# Patient Record
Sex: Female | Born: 1955 | Race: Black or African American | Hispanic: No | Marital: Single | Smoking: Current every day smoker
Health system: Southern US, Community
[De-identification: ages and names within clinical notes are randomized; demographics above are authoritative.]

## PROBLEM LIST (undated history)

## (undated) DIAGNOSIS — M5126 Other intervertebral disc displacement, lumbar region: Secondary | ICD-10-CM

## (undated) DIAGNOSIS — M5136 Other intervertebral disc degeneration, lumbar region: Secondary | ICD-10-CM

## (undated) DIAGNOSIS — M51369 Other intervertebral disc degeneration, lumbar region without mention of lumbar back pain or lower extremity pain: Secondary | ICD-10-CM

## (undated) DIAGNOSIS — M1990 Unspecified osteoarthritis, unspecified site: Secondary | ICD-10-CM

## (undated) DIAGNOSIS — I10 Essential (primary) hypertension: Secondary | ICD-10-CM

## (undated) HISTORY — PX: JOINT REPLACEMENT: SHX530

## (undated) HISTORY — PX: ABDOMINAL HYSTERECTOMY: SHX81

---

## 2005-06-15 ENCOUNTER — Ambulatory Visit (HOSPITAL_BASED_OUTPATIENT_CLINIC_OR_DEPARTMENT_OTHER): Admission: RE | Admit: 2005-06-15 | Discharge: 2005-06-15 | Payer: Self-pay | Admitting: Orthopedic Surgery

## 2006-09-13 ENCOUNTER — Ambulatory Visit (HOSPITAL_BASED_OUTPATIENT_CLINIC_OR_DEPARTMENT_OTHER): Admission: RE | Admit: 2006-09-13 | Discharge: 2006-09-13 | Payer: Self-pay | Admitting: Orthopedic Surgery

## 2010-07-14 NOTE — Op Note (Signed)
[name], [name]                ACCOUNT NO.:  [id number]   MEDICAL RECORD NO.:  [id number]          PATIENT TYPE:  AMB   LOCATION:  DSC                          FACILITY:  [facility]   PHYSICIAN:  Katy Fitch. Sypher, M.D. DATE OF BIRTH:  Jan 17, 1956   DATE OF PROCEDURE:  09/13/2006  DATE OF DISCHARGE:                               OPERATIVE REPORT   PREOPERATIVE DIAGNOSIS:  Entrapped neuropathy median nerve, left carpal  tunnel (severe).   POSTOPERATIVE DIAGNOSIS:  Entrapped neuropathy median nerve, left carpal  tunnel (severe).   OPERATION:  Release of left transverse carpal ligament.   OPERATING SURGEON:  Josephine Igo, M.D.   ASSISTANT:  Molly Maduro Dasnoit PA-C.   ANESTHESIA:  General by LMA.   SUPERVISING ANESTHESIOLOGIST:  Dr. Jairo Ben.   INDICATIONS:  [name] is a 55 year old woman referred for  evaluation and management of bilateral hand numbness.  She is status  post release of her right transcarpal ligament.  She recently returned  with severe numbness in the left hand.  Clinical examination and  electrodiagnostic studies demonstrated substantial left median  neuropathy at the level of the wrist.   Due to a failed respond to nonoperative measures, she is brought to the  operating room at this time for release of her left transverse carpal  ligament.   PROCEDURE:  Sabrina Alexander was brought to the operating room and placed in  supine position upon the operating table.   Following induction of general anesthesia by LMA technique, the left arm  was prepped with Betadine soap solution and sterilely draped.  A  pneumatic tourniquet was applied to the proximal brachium.   Following exsanguination of the left arm with an Esmarch bandage, an  arterial tourniquet was inflated to 220 mmHg.  The procedure commenced  with a short incision in the line of the ring finger in the palm.  The  subcutaneous tissues were carefully divided, revealing the palmar  fascia.  The  palmar fascia was split, revealing a rather complex anatomy  of the distal margin of the transverse carpal ligament.   There were two layers which were gently isolated from the thenar  musculature and the ulnar bursa overlying the median nerve.   Once a path was created along the ulnar aspect of the transverse carpal  ligament, separating the ulnar bursa from the deep side of the ligament,  the ligament was released along its ulnar border with scissors extending  into the distal forearm.   This widely opened the carpal canal.   No mass or other predicaments were noted.   Bleeding points along the margin of the released ligament were  electrocauterized with bipolar current followed by repair of the skin  with intradermal 3-0 Prolene suture.   A compressive dressing was applied with a volar plaster splint,  maintaining the wrist in 5 degrees of dorsiflexion.   For aftercare, Ms. Rockefeller was provided a prescription for Vicodin 5 mg  one p.o. every four to six hours p.r.n. pain (20 tablets without  refill).   She will return to our office for follow-up in one  week or sooner if  there are any problems.      Katy Fitch Sypher, M.D.  Electronically Signed     RVS/MEDQ  D:  09/13/2006  T:  09/14/2006  Job:  [id number]   cc:   Katy Fitch. Sypher, M.D.

## 2010-07-17 NOTE — Op Note (Signed)
NAMEMARIETA, Sabrina Alexander                ACCOUNT NO.:  [id number]   MEDICAL RECORD NO.:  [id number]          PATIENT TYPE:  AMB   LOCATION:  DSC                          FACILITY:  [facility]   PHYSICIAN:  Katy Fitch. Sypher, M.D. DATE OF BIRTH:  1955-03-31   DATE OF PROCEDURE:  06/15/2005  DATE OF DISCHARGE:  06/15/2005                                 OPERATIVE REPORT   For the record this dictation was lost when the e-sign had a malfunction. I  was observing the operative note, when the e-sign had a technical breakdown  and I actually watched the op note disappear off the screen.   PREOPERATIVE DIAGNOSIS:  Right carpal tunnel syndrome.   POSTOP DIAGNOSIS:  Right carpal tunnel syndrome.   OPERATIONS:  Release of right transverse carpal ligament.   OPERATING SURGEON:  Josephine Igo, M.D.   ASSISTANT:  Molly Maduro Dasnoit PA-C.   ANESTHESIA:  General by LMA.   SUPERVISING ANESTHESIOLOGIST:  I do not know because I do not have the  record.  That can be found in the operative notes handwritten.   INDICATIONS:  Sabrina Alexander is a 55 year old woman employed by [employer] referred by Dr. Heide Scales for evaluation and  management of hand numbness and pain.   Clinical examination suggested carpal tunnel syndrome.  Electrodiagnostic  studies that had been previously obtained on April 27, 2005 documented a  severe right carpal tunnel syndrome.   She was referred for upper extremity orthopedic evaluation, anticipating  release of her right transverse carpal ligament.   PROCEDURE:  Sabrina Alexander is brought to the operating room and placed in  supine position on the table.   Following anesthesia consultation, general anesthesia by LMA was suggested  and induced.   The right arm was prepped with Betadine soap solution, sterilely draped.  A  pneumatic tourniquet on proximal brachium was inflated to 220 mmHg.   Procedure commenced with a short incision in line of the ring  finger.  The  subcutaneous tissues were carefully divided revealing the palmar fascia.  This was split longitudinally to reveal the common sensory branch of the  median nerve.   These were followed back to transverse carpal ligament which was gently  isolated from the median nerve.   The ligament released along its ulnar border extending into the distal  forearm.  No masses or other predicaments were noted in the carpal canal.   Bleeding points along the margin of the released ligament were  electrocauterized with bipolar current followed by repair of the skin with  intradermal 3-0 Prolene suture.   A compressive dressing applied with a volar plaster splint maintaining the  wrist in 5 degrees dorsiflexion.      Katy Fitch Sypher, M.D.  Electronically Signed     RVS/MEDQ  D:  07/13/2005  T:  07/14/2005  Job:  [id number]

## 2010-12-14 LAB — POCT HEMOGLOBIN-HEMACUE
Hemoglobin: 14.5
Operator id: 128471

## 2013-12-23 DIAGNOSIS — N951 Menopausal and female climacteric states: Secondary | ICD-10-CM | POA: Insufficient documentation

## 2014-08-16 DIAGNOSIS — Z90710 Acquired absence of both cervix and uterus: Secondary | ICD-10-CM | POA: Insufficient documentation

## 2015-03-17 ENCOUNTER — Encounter (HOSPITAL_BASED_OUTPATIENT_CLINIC_OR_DEPARTMENT_OTHER): Payer: Self-pay | Admitting: *Deleted

## 2015-03-17 ENCOUNTER — Emergency Department (HOSPITAL_BASED_OUTPATIENT_CLINIC_OR_DEPARTMENT_OTHER)
Admission: EM | Admit: 2015-03-17 | Discharge: 2015-03-17 | Disposition: A | Discharge status: Home / Self Care | Payer: BLUE CROSS/BLUE SHIELD | Attending: Emergency Medicine | Admitting: Emergency Medicine

## 2015-03-17 ENCOUNTER — Emergency Department (HOSPITAL_BASED_OUTPATIENT_CLINIC_OR_DEPARTMENT_OTHER): Payer: BLUE CROSS/BLUE SHIELD

## 2015-03-17 DIAGNOSIS — M19072 Primary osteoarthritis, left ankle and foot: Secondary | ICD-10-CM | POA: Insufficient documentation

## 2015-03-17 DIAGNOSIS — F17200 Nicotine dependence, unspecified, uncomplicated: Secondary | ICD-10-CM | POA: Insufficient documentation

## 2015-03-17 DIAGNOSIS — M79672 Pain in left foot: Secondary | ICD-10-CM | POA: Diagnosis present

## 2015-03-17 HISTORY — DX: Unspecified osteoarthritis, unspecified site: M19.90

## 2015-03-17 MED ORDER — MELOXICAM 7.5 MG PO TABS
7.5000 mg | ORAL_TABLET | Freq: Every day | ORAL | Status: DC
Start: 2015-03-17 — End: 2015-05-15

## 2015-03-17 NOTE — ED Provider Notes (Signed)
CSN: [id number]     Arrival date & time 03/17/15  1640 History   First MD Initiated Contact with Patient 03/17/15 1923     Chief Complaint  Patient presents with  . Foot Pain     (Consider location/radiation/quality/duration/timing/severity/associated sxs/prior Treatment) The history is provided by the patient.     Pt presents with left foot pain x 1 month.  Pain is aching, across top of foot, worse with stapping on it after period of rest.  Denies weakness or numbness of the foot.  Denies injury.  Denies fever, chills, leg swelling.  She has no medical problems with the exception of arthritis.   No new shoes or change in activities.  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  She has tried heat, ice, ace wrap, ibuprofen without relief.   Past Medical History  Diagnosis Date  . Arthritis    Past Surgical History  Procedure Laterality Date  . Abdominal hysterectomy     No family history on file. Social History  Substance Use Topics  . Smoking status: Current Every Day Smoker -- 0.50 packs/day  . Smokeless tobacco: None  . Alcohol Use: No   OB History    No data available     Review of Systems  Constitutional: Negative for fever and chills.  Cardiovascular: Negative for leg swelling.  Musculoskeletal: Positive for arthralgias.  Skin: Negative for color change and wound.  Allergic/Immunologic: Negative for immunocompromised state.  Neurological: Negative for weakness and numbness.  Hematological: Does not bruise/bleed easily.  Psychiatric/Behavioral: Negative for self-injury.      Allergies  Review of patient's allergies indicates no known allergies.  Home Medications   Prior to Admission medications   Medication Sig Start Date End Date Taking? Authorizing Provider  meloxicam (MOBIC) 7.5 MG tablet Take 1 tablet (7.5 mg total) by mouth daily. 03/17/15   Trixie DredgeEmily Kanyia Heaslip, PA-C   BP 155/92 mmHg  Pulse 77  Temp(Src) 98 F (36.7 C) (Oral)  Resp  18  Ht 5\' 6"  (1.676 m)  Wt 91.173 kg  BMI 32.46 kg/m2  SpO2 98% Physical Exam  Constitutional: She appears well-developed and well-nourished. No distress.  HENT:  Head: Normocephalic and atraumatic.  Neck: Neck supple.  Pulmonary/Chest: Effort normal.  Musculoskeletal:       Left ankle: Normal.       Left lower leg: Normal.       Left foot: There is bony tenderness. There is normal range of motion, no swelling, normal capillary refill, no crepitus and no laceration.       Feet:  Left foot no skin changes.  No erythema, edema, warmth, discharge, or tenderness.  Moves all toes easily, sensation intact, capillary refill < 2 seconds.  Dorsalis pedis pulse intact.    Neurological: She is alert.  Skin: She is not diaphoretic.  Nursing note and vitals reviewed.   ED Course  Procedures (including critical care time) Labs Review Labs Reviewed - No data to display  Imaging Review Dg Foot Complete Left  03/17/2015  CLINICAL DATA:  Left foot pain for 1 month. EXAM: LEFT FOOT - COMPLETE 3+ VIEW COMPARISON:  None. FINDINGS: Advanced midfoot degenerative changes suspicious for early Charcot disease. Un inflammatory arthropathy is also possible but probably less likely at this location. There are mild degenerative changes at the metatarsal phalangeal joints and interphalangeal joints. Moderate degenerative changes involving the first metatarsal phalangeal joint and probable subchondral cystic change or erosion. There is  significant hallux valgus deformity. No acute bony findings or air destructive bony changes. Small calcaneal heel spur. IMPRESSION: No acute fracture. Advanced midfoot degenerative changes could reflect early Charcot disease/ neuropathic process. Electronically Signed   By: Rudie Meyer M.D.   On: 03/17/2015 17:13     EKG Interpretation None      MDM   Final diagnoses:  Left foot pain  Osteoarthritis of left foot, unspecified osteoarthritis type    Afebrile, nontoxic  patient with left dorsal foot pain x 1 month without injury. Xray shows Extensive arthritis/degenerative changes, possible early charcot foot.  Neurovascularly intact.    D/C home with orthopedic,podiatry follow up, mobic.   Discussed result, findings, treatment, and follow up  with patient.  Pt given return precautions.  Pt verbalizes understanding and agrees with plan.         Trixie Dredge, PA-C 03/17/15 2127  Rolan Bucco, MD 03/17/15 2131

## 2015-03-17 NOTE — ED Notes (Signed)
Pt verbalizes understanding of d/c instructions and denies any further needs at this time. 

## 2015-03-17 NOTE — ED Notes (Signed)
Left foot pain, no injury, no significant swelling or trauma, pain unrelieved by OTC meds

## 2015-03-17 NOTE — Discharge Instructions (Signed)
Read the information below.  Use the prescribed medication as directed.  Please discuss all new medications with your pharmacist.  You may return to the Emergency Department at any time for worsening condition or any new symptoms that concern you.    If you develop uncontrolled pain, weakness or numbness of the extremity, severe discoloration of the skin, or you are unable to walk or move your toes, return to the ER for a recheck.       Osteoarthritis Osteoarthritis is a disease that causes soreness and inflammation of a joint. It occurs when the cartilage at the affected joint wears down. Cartilage acts as a cushion, covering the ends of bones where they meet to form a joint. Osteoarthritis is the most common form of arthritis. It often occurs in older people. The joints affected most often by this condition include those in the:  Ends of the fingers.  Thumbs.  Neck.  Lower back.  Knees.  Hips. CAUSES  Over time, the cartilage that covers the ends of bones begins to wear away. This causes bone to rub on bone, producing pain and stiffness in the affected joints.  RISK FACTORS Certain factors can increase your chances of having osteoarthritis, including:  Older age.  Excessive body weight.  Overuse of joints.  Previous joint injury. SIGNS AND SYMPTOMS   Pain, swelling, and stiffness in the joint.  Over time, the joint may lose its normal shape.  Small deposits of bone (osteophytes) may grow on the edges of the joint.  Bits of bone or cartilage can break off and float inside the joint space. This may cause more pain and damage. DIAGNOSIS  Your health care provider will do a physical exam and ask about your symptoms. Various tests may be ordered, such as:  X-rays of the affected joint.  Blood tests to rule out other types of arthritis. Additional tests may be used to diagnose your condition. TREATMENT  Goals of treatment are to control pain and improve joint function.  Treatment plans may include:  A prescribed exercise program that allows for rest and joint relief.  A weight control plan.  Pain relief techniques, such as:  Properly applied heat and cold.  Electric pulses delivered to nerve endings under the skin (transcutaneous electrical nerve stimulation [TENS]).  Massage.  Certain nutritional supplements.  Medicines to control pain, such as:  Acetaminophen.  Nonsteroidal anti-inflammatory drugs (NSAIDs), such as naproxen.  Narcotic or central-acting agents, such as tramadol.  Corticosteroids. These can be given orally or as an injection.  Surgery to reposition the bones and relieve pain (osteotomy) or to remove loose pieces of bone and cartilage. Joint replacement may be needed in advanced states of osteoarthritis. HOME CARE INSTRUCTIONS   Take medicines only as directed by your health care provider.  Maintain a healthy weight. Follow your health care provider's instructions for weight control. This may include dietary instructions.  Exercise as directed. Your health care provider can recommend specific types of exercise. These may include:  Strengthening exercises. These are done to strengthen the muscles that support joints affected by arthritis. They can be performed with weights or with exercise bands to add resistance.  Aerobic activities. These are exercises, such as brisk walking or low-impact aerobics, that get your heart pumping.  Range-of-motion activities. These keep your joints limber.  Balance and agility exercises. These help you maintain daily living skills.  Rest your affected joints as directed by your health care provider.  Keep all follow-up visits as  directed by your health care provider. SEEK MEDICAL CARE IF:   Your skin turns red.  You develop a rash in addition to your joint pain.  You have worsening joint pain.  You have a fever along with joint or muscle aches. SEEK IMMEDIATE MEDICAL CARE  IF:  You have a significant loss of weight or appetite.  You have night sweats. FOR MORE INFORMATION   National Institute of Arthritis and Musculoskeletal and Skin Diseases: www.niams.http://www.myers.net/  [employer] on Aging: https://walker.com/  American College of Rheumatology: www.rheumatology.org   This information is not intended to replace advice given to you by your health care provider. Make sure you discuss any questions you have with your health care provider.   Document Released: 02/15/2005 Document Revised: 03/08/2014 Document Reviewed: 10/23/2012 Elsevier Interactive Patient Education 2016 [employer].    Emergency Department Resource Guide 1) Find a Doctor and Pay Out of Pocket Although you won't have to find out who is covered by your insurance plan, it is a good idea to ask around and get recommendations. You will then need to call the office and see if the doctor you have chosen will accept you as a new patient and what types of options they offer for patients who are self-pay. Some doctors offer discounts or will set up payment plans for their patients who do not have insurance, but you will need to ask so you aren't surprised when you get to your appointment.  2) Contact Your Local Health Department Not all health departments have doctors that can see patients for sick visits, but many do, so it is worth a call to see if yours does. If you don't know where your local health department is, you can check in your phone book. The CDC also has a tool to help you locate your state's health department, and many state websites also have listings of all of their local health departments.  3) Find a Walk-in Clinic If your illness is not likely to be very severe or complicated, you may want to try a walk in clinic. These are popping up all over the country in pharmacies, drugstores, and shopping centers. They're usually staffed by nurse practitioners or physician assistants that have  been trained to treat common illnesses and complaints. They're usually fairly quick and inexpensive. However, if you have serious medical issues or chronic medical problems, these are probably not your best option.  No Primary Care Doctor: - Call Health Connect at  [phone] - they can help you locate a primary care doctor that  accepts your insurance, provides certain services, etc. - Physician Referral Service- [phone]  Chronic Pain Problems: Organization         Address  Phone   Notes  [facility]  [phone] Patients need to be referred by their primary care doctor.   Medication Assistance: Organization         Address  Phone   Notes  [facility] Medication [facility] [address] [place] [place], [place] [address] [phone] --Must be a resident of [facility] -- Must have NO insurance coverage whatsoever (no Medicaid/ Medicare, etc.) -- The pt. MUST have a primary care doctor that directs their care regularly and follows them in the community   [facility]ist  [phone]   [employer]  [phone]    Agencies that provide inexpensive medical care: Organization         Address  Phone   Notes  [facility]  [phone]   [facility]    [phone]   [facility] [address] [place], [place] [address] [phone]   [facility] [address] [place] [place] [phone]   Planned Parenthood    [phone]   [facility]    [phone]   Community [facility] and [facility]  [address], [place] Phone:  [phone], Fax:  [phone] Hours of Operation:  9 am - 6 pm, M-F.  Also accepts Medicaid/Medicare and self-pay.  [facility]  [address], [place] Phone: [phone], Fax: [phone]. Hours of Operation:  8:30 am - 5:30 pm, M-F.  Also accepts Medicaid and  self-pay.  [facility] [place] [address] [place] Phone: [phone]   [facility] [address] Natasha Bence [place], [place] [phone], Ext. [phone] Mondays & Thursdays: 7-9 AM.  First 15 patients are seen on a first come, first serve basis.    Medicaid-accepting [facility] Providers:  Organization         Address  Phone   Notes  [facility] [address], [place] [phone] Also accepts self-pay patients.  [facility] [address] Laurell Josephs [place], [place]  [phone]   [facility] [address], [place] [phone]   [facility] [address], [place] [phone]   Renaye Rakers [address], [place]   [phone] Only accepts [place] patients after they have their name applied to their card.   Self-Pay (no insurance) in [facility]:  Organization         Address  Phone   Notes  Sickle Cell Patients, [facility] [address] [place], [place] [phone]   [facility] [address] [place], [place] [phone]   [facility]  [address], [place] [phone]   [facility]/Dr. Osei-Bonsu  [address], [place] or [address], [place] [phone] Phone number for both [place] and [place] locations is the same.  [facility]gent [facility] [address] [place] [phone]   [facility] [address], [place] or [address] [phone]   [facility] [address] [place] [phone], phone; [phone], fax Sees patients 1st and 3rd Saturday of every month.  Must not qualify for public or private insurance (i.e. Medicaid, Medicare, [place] Health Choice, Veterans' Benefits)  Household income  should be no more than 200% of the poverty level The clinic cannot treat you if you are pregnant or think you are pregnant  Sexually transmitted diseases are not treated at the clinic.    Dental Care: Organization         Address  Phone  Notes  [facility] Department of [facility] [address] [place], [place] [phone] Accepts children up to age 75 who are enrolled in [place] or [place] Health Choice; pregnant women with a Medicaid card; and children who have applied for Medicaid or [place] Health Choice, but were declined, whose parents can pay a reduced fee at time of service.  [facility] Department of  [facility]  [address], [place] [phone] Accepts children up to age 24 who are enrolled in Medicaid or [place] Health Choice; pregnant women with a Medicaid card; and children who have applied for Medicaid or [place] Health Choice, but were declined, whose parents can pay a reduced fee at time of service.  [facility]  [address] [place], [place] [phone] Patients are seen by appointment only. Walk-ins are not accepted. [facility] will see patients 11 years of age and older. Monday - Tuesday (8am-5pm) Most Wednesdays (8:30-5pm) $30 per visit, cash only  [facility] Adult Dental Access PRO[facility]  [address], [facility] [phone] Patients are seen by appointment only. Walk-ins are not accepted. [facility] will see patients 7 years of age and older. One Wednesday Evening (Monthly: Volunteer Based).  $30 per visit, cash only  [employer]  [phone] for adults; Children under age 37, call Graduate Pediatric Dentistry at [phone]. Children aged 43-14, please call [phone] to request a pediatric application.  Dental services are provided in all areas of dental care including fillings, crowns and bridges, complete and partial dentures, implants, gum treatment,  root canals, and extractions. Preventive care is also provided. Treatment is provided to both adults and children. Patients are selected via a lottery and there is often a waiting list.   [facility] [address], [place]  [phone] [email or web address]   [facility] [address] [place], [place] [phone], Ext. [phone] Second and Fourth Thursday of each month, opens at 6:30 AM; Clinic ends at 9 AM.  Patients are seen on a first-come first-served basis, and a limited number are seen during each clinic.   [facility]  [address] Ether Griffins [place], [place] [phone]   Eligibility Requirements You must have lived in [place], [place], or [place] counties for at least the last three months.   You cannot be eligible for state or federal sponsored [place], including [employer], [place], or [employer].   You generally cannot be eligible for healthcare insurance through your employer.    How to apply: Eligibility screenings are held every Tuesday and Wednesday afternoon from 1:00 pm until 4:00 pm. You do not need an appointment for the interview!  [facility] [address], [place], [place] [phone]   [facility] Health Department  [phone]   [facility] Health Department  [phone]   [facility] Health Department  [phone]    Behavioral Health Resources in the Community: Intensive Outpatient Programs Organization         Address  Phone  Notes  [facility] Services [address] [place], [place] [phone]   [facility] [address] [place], [place] [phone]   [facility] [address], [place], [place]  [phone]   [facility] Mental Health [address],  [place], [place] [phone] or [phone]   Substance Abuse Resources Organization         Address  Phone  Notes  Al[facility] Drug Services   [phone]   [facility]  [phone]   [facility] [place]  [phone]   Floydene Flock  [phone]   Residential & Outpatient Substance Abuse Program  [phone]   Psychological Services Organization         Address  Phone  Notes  [facility]  [phone]   [facility]  [phone]- [phone]   [facility] [address], [place] [phone] or [phone]    Mobile Crisis Teams Organization         Address  Phone  Notes  [facility], Mobile Crisis Care Unit  [phone]   [facility]  [address] [place], [place] [phone]   Doristine LocksSharon DeEsch [address] [place] [phone]    Self-Help/Support Groups Organization         Address  Phone             Notes  [facility] [place] - variety of support groups  336- [phone] Call for more information  Narcotics Anonymous (NA), [facility]aring [facility] [address], [place]  2 meetings at this location   Statisticianesidential Treatment Programs Organization         Address  Phone  Notes  [facility] [address],    [place]  [phone]   [facility]  [address], [place] [phone], [place], [place] [phone]   [facility] [address], [place] [phone] Admissions: 8am-3pm M-F  [facility] [address]    [place], [place] [phone]   [facility] [address], [place], [place] [phone]   [facility] [address]  [place], [place] [phone]   [facility] - Intensive Outpatient [address], [place], [place] [phone]   [facility] ([facility] [address]  [place], [place] [phone] or [phone]   [facility] ([facility]) [address] [place], [place] [phone] Accepts Medicaid  [facility] [address] [place] [address]  [place] [phone] Substance Abuse/Addiction Treatment   [facility] Behavioral Health Resources Organization         Address  Phone  Notes  [facility]  [phone]) [phone]   Angie FavaJulie Brannon, PhD [address], [address] [place], [place]   [phone] or [phone]   [facility]   [address] [place], [place] [phone]   [facility] [address], [place], [place] [phone]) [phone] Insurance/Medicaid/sponsorship through [facility]  [facility] [address]                                    [place], [place] [phone]) [phone] Therapy/tele-psych/case  [facility] [address].   [place], [place] [phone]) [phone]    Dr. Lolly MustacheArfeen  [phone]   [facility] [place]  [employer] [facility] 1) [address], [place] 2) [address], [place] 3)  [address], [place] [phone]  [phone]) [phone]   [facility] Child Abuse Hotline [phone]) [phone] or [phone](336) [phone] (After Hours)

## 2015-03-17 NOTE — ED Notes (Signed)
Left foot pain for a month. Pain is across the top of her foot. No injury.

## 2015-04-11 ENCOUNTER — Ambulatory Visit: Payer: BLUE CROSS/BLUE SHIELD | Admitting: Family Medicine

## 2015-05-15 ENCOUNTER — Ambulatory Visit (INDEPENDENT_AMBULATORY_CARE_PROVIDER_SITE_OTHER): Payer: BLUE CROSS/BLUE SHIELD | Admitting: Family Medicine

## 2015-05-15 VITALS — BP 127/84 | HR 73 | Ht 66.0 in | Wt 200.0 lb

## 2015-05-15 DIAGNOSIS — M79672 Pain in left foot: Secondary | ICD-10-CM | POA: Diagnosis not present

## 2015-05-15 MED ORDER — MELOXICAM 15 MG PO TABS: 15.0000 mg | ORAL_TABLET | Freq: Every day | ORAL | Status: AC

## 2015-05-15 MED ORDER — HYDROCODONE-ACETAMINOPHEN 5-325 MG PO TABS: 1.0000 | ORAL_TABLET | Freq: Four times a day (QID) | ORAL | Status: AC | PRN

## 2015-05-15 NOTE — Patient Instructions (Signed)
Most of your pain is due to midfoot arthritis. These are the 4 classes of medicine you can take for this: Tylenol 500mg  1-2 tabs three times a day for pain. Meloxicam 15mg  daily with food for pain and inflammation. Glucosamine sulfate 750mg  twice a day is a supplement that may help. Capsaicin, aspercreme, or biofreeze topically up to four times a day may also help with pain. Cortisone injections are an option - if it gets bad enough that you want to try this let us know. Arch binders can help with this but I wouldn't use them if they cause more pain. Shoe inserts with good arch support may be helpful - our sports insoles or something like dr. Jari Sportsmanscholls active series. Its ok to wear a short boot if the pain is severe. Also ok to take norco as needed for severe pain (no driving on this). Heat or ice 15 minutes at a time 3-4 times a day as needed to help with pain. Follow up with me in 1 month.

## 2015-05-21 DIAGNOSIS — M79672 Pain in left foot: Secondary | ICD-10-CM | POA: Insufficient documentation

## 2015-05-21 NOTE — Assessment & Plan Note (Signed)
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  Discussed tylenol, glucosamine, topical medications.  She will continue with meloxicam which has helped also.  Arch binders if this relieves pain as well as good arch supports with cushion.  Norco as needed for severe pain.  Heat/ice.  F/u in 1 month.

## 2015-05-21 NOTE — Progress Notes (Signed)
PCP: No primary care provider on file.  Subjective:   HPI: Patient is a 60 y.o. female here for left foot pain.  Patient reports she has had left dorsal foot pain for about 3 months. Pain 0/10 at rest, up to 5/10 with pressure and a lot of walking, can be sharp. Has improved with meloxicam. Worse when on feet all day at work. Also worse when using a foot press at work. Can radiate up her lower leg. No skin changes, fever.  Past Medical History  Diagnosis Date  . Arthritis     No current outpatient prescriptions on file prior to visit.   No current facility-administered medications on file prior to visit.    Past Surgical History  Procedure Laterality Date  . Abdominal hysterectomy      No Known Allergies  Social History   Social History  . Marital Status: Single    Spouse Name: N/A  . Number of Children: N/A  . Years of Education: N/A   Occupational History  . Not on file.   Social History Main Topics  . Smoking status: Current Every Day Smoker -- 0.50 packs/day  . Smokeless tobacco: Not on file  . Alcohol Use: No  . Drug Use: No  . Sexual Activity: Not on file   Other Topics Concern  . Not on file   Social History Narrative  . No narrative on file    No family history on file.  BP 127/84 mmHg  Pulse 73  Ht 5\' 6"  (1.676 m)  Wt 200 lb (90.719 kg)  BMI 32.30 kg/m2  Review of Systems: See HPI above.    Objective:  Physical Exam:  Gen: NAD, comfortable in exam room  Left foot/ankle: Localized firm swelling TMT joints about 2nd-4th TMT joints.  No other gross deformity, swelling, ecchymoses FROM ankle without pain. TTP midfoot in area of localized swelling. Negative ant drawer and talar tilt.   Negative syndesmotic compression. Negative calcaneal squeeze. Thompsons test negative. NV intact distally.    Assessment & Plan:  1. 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   Discussed tylenol, glucosamine, topical medications.  She will continue with meloxicam which has helped also.  Arch binders if this relieves pain as well as good arch supports with cushion.  Norco as needed for severe pain.  Heat/ice.  F/u in 1 month.

## 2015-06-16 ENCOUNTER — Ambulatory Visit: Payer: BLUE CROSS/BLUE SHIELD | Admitting: Family Medicine

## 2015-06-27 ENCOUNTER — Encounter (INDEPENDENT_AMBULATORY_CARE_PROVIDER_SITE_OTHER): Payer: Self-pay

## 2015-06-27 ENCOUNTER — Ambulatory Visit (INDEPENDENT_AMBULATORY_CARE_PROVIDER_SITE_OTHER): Payer: BLUE CROSS/BLUE SHIELD | Admitting: Family Medicine

## 2015-06-27 DIAGNOSIS — M79672 Pain in left foot: Secondary | ICD-10-CM

## 2015-06-27 DIAGNOSIS — M25552 Pain in left hip: Secondary | ICD-10-CM

## 2015-06-27 MED ORDER — METHYLPREDNISOLONE ACETATE 40 MG/ML IJ SUSP
40.0000 mg | Freq: Once | INTRAMUSCULAR | Status: AC
  Administered 2015-06-27: 20 mg via INTRA_ARTICULAR

## 2015-06-27 NOTE — Patient Instructions (Signed)
Most of your pain is due to midfoot arthritis. These are the 4 classes of medicine you can take for this: Tylenol 500mg  1-2 tabs three times a day for pain. Meloxicam 15mg  daily with food for pain and inflammation. Glucosamine sulfate 750mg  twice a day is a supplement that may help. Capsaicin, aspercreme, or biofreeze topically up to four times a day may also help with pain. Cortisone injections are an option - you were given this today. Shoe inserts with good arch support may be helpful - our sports insoles or something like dr. Jari Sportsmanscholls active series. Its ok to wear a short boot if the pain is severe. Heat or ice 15 minutes at a time 3-4 times a day as needed to help with pain. Follow up with me in 1 month.  Let me know if you want to do physical therapy for your hip arthritis. Otherwise do the home exercises 3 sets of 10 once a day most days of the week.

## 2015-06-30 DIAGNOSIS — M25552 Pain in left hip: Secondary | ICD-10-CM | POA: Insufficient documentation

## 2015-06-30 NOTE — Assessment & Plan Note (Signed)
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  Previously Discussed tylenol, glucosamine, topical medications.  Continue meloxicam.  Cam walker as needed.  Injection given today.  Heat/ice.  Norco as needed severe pain.  F/u in 1 month.  After informed written consent patient was seated on exam table.  Area of maximal pain about TMT joint 3rd MT area prepped with alcohol swab after identification by ultrasound and injected with 0.5:0.735mL marcaine: depomedrol.  Patient tolerated procedure well without immediate complications.

## 2015-06-30 NOTE — Assessment & Plan Note (Signed)
consistent with DJD.  Shown some home exercises to do.  Medications as noted above.  She will consider physical therapy, intraarticular injection if not improving.

## 2015-06-30 NOTE — Progress Notes (Signed)
PCP: No primary care provider on file.  Subjective:   HPI: Patient is a 60 y.o. female here for left foot pain.  3/16: Patient reports she has had left dorsal foot pain for about 3 months. Pain 0/10 at rest, up to 5/10 with pressure and a lot of walking, can be sharp. Has improved with meloxicam. Worse when on feet all day at work. Also worse when using a foot press at work. Can radiate up her lower leg. No skin changes, fever.  4/28: Patient reports she does well when wearing boot but when not wearing this pain is 6/10 and sharp dorsally. Slight swelling. Taking meloxicam. Pain is 2/10 in boot. Worse with walking and standing. Arch binders hurt worse. Also having anterior left hip pain at 3/10 level. No radiation of this. No numbness or tingling. No back pain associated with hip pain.  Past Medical History  Diagnosis Date  . Arthritis     Current Outpatient Prescriptions on File Prior to Visit  Medication Sig Dispense Refill  . estradiol (ESTRACE) 1 MG tablet Take 1 mg by mouth.    Marland Kitchen HYDROcodone-acetaminophen (NORCO) 5-325 MG tablet Take 1 tablet by mouth every 6 (six) hours as needed for moderate pain. 40 tablet 0  . meloxicam (MOBIC) 15 MG tablet Take 1 tablet (15 mg total) by mouth daily. 30 tablet 2   No current facility-administered medications on file prior to visit.    Past Surgical History  Procedure Laterality Date  . Abdominal hysterectomy      No Known Allergies  Social History   Social History  . Marital Status: Single    Spouse Name: N/A  . Number of Children: N/A  . Years of Education: N/A   Occupational History  . Not on file.   Social History Main Topics  . Smoking status: Current Every Day Smoker -- 0.50 packs/day  . Smokeless tobacco: Not on file  . Alcohol Use: No  . Drug Use: No  . Sexual Activity: Not on file   Other Topics Concern  . Not on file   Social History Narrative  . No narrative on file    No family history on  file.  There were no vitals taken for this visit.  Review of Systems: See HPI above.    Objective:  Physical Exam:  Gen: NAD, comfortable in exam room  Left foot/ankle: Localized firm swelling TMT joints about 2nd-4th TMT joints.  No other gross deformity, swelling, ecchymoses FROM ankle without pain. TTP midfoot in area of localized swelling. Negative ant drawer and talar tilt.   Negative syndesmotic compression. Negative calcaneal squeeze. Thompsons test negative. NV intact distally.  Left hip: Mod limitation IR with reproduction of pain. No trochanter, other focal tenderness to palpation.    Assessment & Plan:  1. 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  Previously Discussed tylenol, glucosamine, topical medications.  Continue meloxicam.  Cam walker as needed.  Injection given today.  Heat/ice.  Norco as needed severe pain.  F/u in 1 month.  After informed written consent patient was seated on exam table.  Area of maximal pain about TMT joint 3rd MT area prepped with alcohol swab after identification by ultrasound and injected with 0.5:0.73mL marcaine: depomedrol.  Patient tolerated procedure well without immediate complications.  2. Left hip pain - consistent with DJD.  Shown some home  exercises to do.  Medications as noted above.  She will consider physical therapy, intraarticular injection if not improving.

## 2016-02-22 ENCOUNTER — Encounter (HOSPITAL_BASED_OUTPATIENT_CLINIC_OR_DEPARTMENT_OTHER): Payer: Self-pay | Admitting: Emergency Medicine

## 2016-02-22 ENCOUNTER — Emergency Department (HOSPITAL_BASED_OUTPATIENT_CLINIC_OR_DEPARTMENT_OTHER)
Admission: EM | Admit: 2016-02-22 | Discharge: 2016-02-22 | Disposition: A | Discharge status: Home / Self Care | Payer: BLUE CROSS/BLUE SHIELD | Attending: Emergency Medicine | Admitting: Emergency Medicine

## 2016-02-22 DIAGNOSIS — F17200 Nicotine dependence, unspecified, uncomplicated: Secondary | ICD-10-CM | POA: Diagnosis not present

## 2016-02-22 DIAGNOSIS — M5414 Radiculopathy, thoracic region: Secondary | ICD-10-CM | POA: Insufficient documentation

## 2016-02-22 DIAGNOSIS — Z79899 Other long term (current) drug therapy: Secondary | ICD-10-CM | POA: Insufficient documentation

## 2016-02-22 DIAGNOSIS — M7989 Other specified soft tissue disorders: Secondary | ICD-10-CM | POA: Diagnosis present

## 2016-02-22 HISTORY — DX: Other intervertebral disc degeneration, lumbar region without mention of lumbar back pain or lower extremity pain: M51.369

## 2016-02-22 HISTORY — DX: Unspecified osteoarthritis, unspecified site: M19.90

## 2016-02-22 HISTORY — DX: Other intervertebral disc displacement, lumbar region: M51.26

## 2016-02-22 HISTORY — DX: Other intervertebral disc degeneration, lumbar region: M51.36

## 2016-02-22 MED ORDER — METHYLPREDNISOLONE 4 MG PO TBPK: ORAL_TABLET | ORAL | 0 refills | Status: AC

## 2016-02-22 MED ORDER — METHYLPREDNISOLONE 4 MG PO TBPK
8.0000 mg | ORAL_TABLET | Freq: Every morning | ORAL | Status: DC
  Filled 2016-02-22: qty 21

## 2016-02-22 MED ORDER — METHYLPREDNISOLONE 4 MG PO TBPK: 8.0000 mg | ORAL_TABLET | Freq: Every evening | ORAL | Status: DC

## 2016-02-22 MED ORDER — HYDROCODONE-ACETAMINOPHEN 5-325 MG PO TABS: 1.0000 | ORAL_TABLET | ORAL | 0 refills | Status: AC | PRN

## 2016-02-22 MED ORDER — METHYLPREDNISOLONE 4 MG PO TBPK
4.0000 mg | ORAL_TABLET | Freq: Three times a day (TID) | ORAL | Status: DC
Start: 2016-02-23 — End: 2016-02-22

## 2016-02-22 MED ORDER — METHYLPREDNISOLONE 4 MG PO TBPK: 4.0000 mg | ORAL_TABLET | ORAL | Status: DC

## 2016-02-22 MED ORDER — HYDROCODONE-ACETAMINOPHEN 5-325 MG PO TABS
1.0000 | ORAL_TABLET | Freq: Once | ORAL | Status: DC
Start: 2016-02-22 — End: 2016-02-22

## 2016-02-22 MED ORDER — METHYLPREDNISOLONE 4 MG PO TBPK
4.0000 mg | ORAL_TABLET | Freq: Four times a day (QID) | ORAL | Status: DC
Start: 2016-02-24 — End: 2016-02-22

## 2016-02-22 NOTE — ED Triage Notes (Signed)
Pt reports left scapular pain with radiation down arm causing numbness in ring and little finger of the same extremity. Reports it has been going since last week. She is an [employer]ore[employer]. Has taken a muscle relaxer w/o relief. Denies chest pain or SOB.

## 2016-02-22 NOTE — ED Notes (Signed)
Pt verbalized understanding of discharge instructions and denies any further questions at this time.   

## 2016-02-22 NOTE — ED Provider Notes (Signed)
[facility]   CSN: [id number] Arrival date & time: 02/22/16  [id number]     History   Chief Complaint Chief Complaint  Patient presents with  . Arm Pain    HPI Sabrina Alexander is a 60 y.o. female.  HPI Patient has pain in her right posterior shoulder that radiates down her arm to her fingertips. Fourth and fifth digits feel numb. Initially she thought she had slept on it wrong. She has tried heating pads and nighttime repositioning. She reports the pain is worsening and is shooting a sharp pain down her arm and to her fingertips which feel slightly numb. Patient still has intact strength and movement. She does work using an Academic librarianembroidery press. This requires her to extend her arms and pulled the embroidery frames up towards her upper chest. He reports there was no acute injury. She reports it feels like when she had her carpal tunnel problem but in different fingers. Past Medical History:  Diagnosis Date  . Arthritis   . Bulging of lumbar intervertebral disc   . Osteoarthritis     Patient Active Problem List   Diagnosis Date Noted  . Left hip pain 06/30/2015  . Left foot pain 05/21/2015  . H/O: hysterectomy 08/16/2014  . Menopausal symptom 12/23/2013    Past Surgical History:  Procedure Laterality Date  . ABDOMINAL HYSTERECTOMY      OB History    No data available       Home Medications    Prior to Admission medications   Medication Sig Start Date End Date Taking? Authorizing Provider  estradiol (ESTRACE) 1 MG tablet Take 1 mg by mouth. 09/09/14  Yes Historical Provider, MD  HYDROcodone-acetaminophen (NORCO) 5-325 MG tablet Take 1 tablet by mouth every 6 (six) hours as needed for moderate pain. 05/15/15   Lenda KelpShane R Hudnall, MD  HYDROcodone-acetaminophen (NORCO/VICODIN) 5-325 MG tablet Take 1-2 tablets by mouth every 4 (four) hours as needed for moderate pain or severe pain. 02/22/16   Arby BarretteMarcy Cyerra Yim, MD  meloxicam (MOBIC) 15 MG tablet Take 1 tablet (15  mg total) by mouth daily. 05/15/15   Lenda KelpShane R Hudnall, MD  methylPREDNISolone (MEDROL DOSEPAK) 4 MG TBPK tablet Per pack instructions 02/22/16   Arby BarretteMarcy Locklyn Henriquez, MD    Family History No family history on file.  Social History Social History  Substance Use Topics  . Smoking status: Current Every Day Smoker    Packs/day: 0.50  . Smokeless tobacco: Never Used  . Alcohol use No     Allergies   Patient has no known allergies.   Review of Systems Review of Systems Constitutional: No fever chills or general illness Respiratory: No cough, chest pain or shortness of breath GI: No abdominal pain no nausea no vomiting Physical Exam Updated Vital Signs BP 152/84 (BP Location: Left Arm)   Pulse 84   Temp 98 F (36.7 C) (Oral)   Resp 18   Ht 5\' 6"  (1.676 m)   Wt 200 lb (90.7 kg)   SpO2 98%   BMI 32.28 kg/m   Physical Exam  Constitutional: She appears well-developed and well-nourished. No distress.  HENT:  Head: Normocephalic and atraumatic.  Eyes: Conjunctivae and EOM are normal.  Neck: Neck supple.  No reproducible cervical pain.  Cardiovascular: Normal rate and regular rhythm.   No murmur heard. Pulmonary/Chest: Effort normal and breath sounds normal. No respiratory distress.  Musculoskeletal: She exhibits tenderness. She exhibits no edema or deformity.  Significant focal tenderness left thorax at approximately  the medial point of the scapula. Moderate arthritic changes of the hand. No edema of the hand. Neurovascular exam of right hand is normal. Intact strength and normal pulse.  Neurological: She is alert.  Skin: Skin is warm and dry.  Psychiatric: She has a normal mood and affect.  Nursing note and vitals reviewed.    ED Treatments / Results  Labs (all labs ordered are listed, but only abnormal results are displayed) Labs Reviewed - No data to display  EKG  EKG Interpretation None       Radiology No results found.  Procedures Procedures (including  critical care time)  Medications Ordered in ED Medications - No data to display   Initial Impression / Assessment and Plan / ED Course  I have reviewed the triage vital signs and the nursing notes.  Pertinent labs & imaging results that were available during my care of the patient were reviewed by me and considered in my medical decision making (see chart for details).  Clinical Course      Final Clinical Impressions(s) / ED Diagnoses   Final diagnoses:  Thoracic radiculopathy  Patient has pain and digit numbness suggestive of ulnar radiculopathy. No suggestion of cardiothoracic etiology. Patient will be treated with a Medrol Dosepak and Vicodin for pain control. Patient has seen Dr. Pearletha ForgeHudnall in the past and will schedule recheck.  New Prescriptions New Prescriptions   HYDROCODONE-ACETAMINOPHEN (NORCO/VICODIN) 5-325 MG TABLET    Take 1-2 tablets by mouth every 4 (four) hours as needed for moderate pain or severe pain.   METHYLPREDNISOLONE (MEDROL DOSEPAK) 4 MG TBPK TABLET    Per pack instructions     Arby BarretteMarcy Jesika Men, MD 02/22/16 [id number]

## 2016-12-08 IMAGING — CR DG FOOT COMPLETE 3+V*L*
3 series · 3 of 3 positions shown · non-contrast
Comparison: None.

CLINICAL DATA: Left foot pain for 1 month.

EXAM:
LEFT FOOT - COMPLETE 3+ VIEW

[t foot ap left]
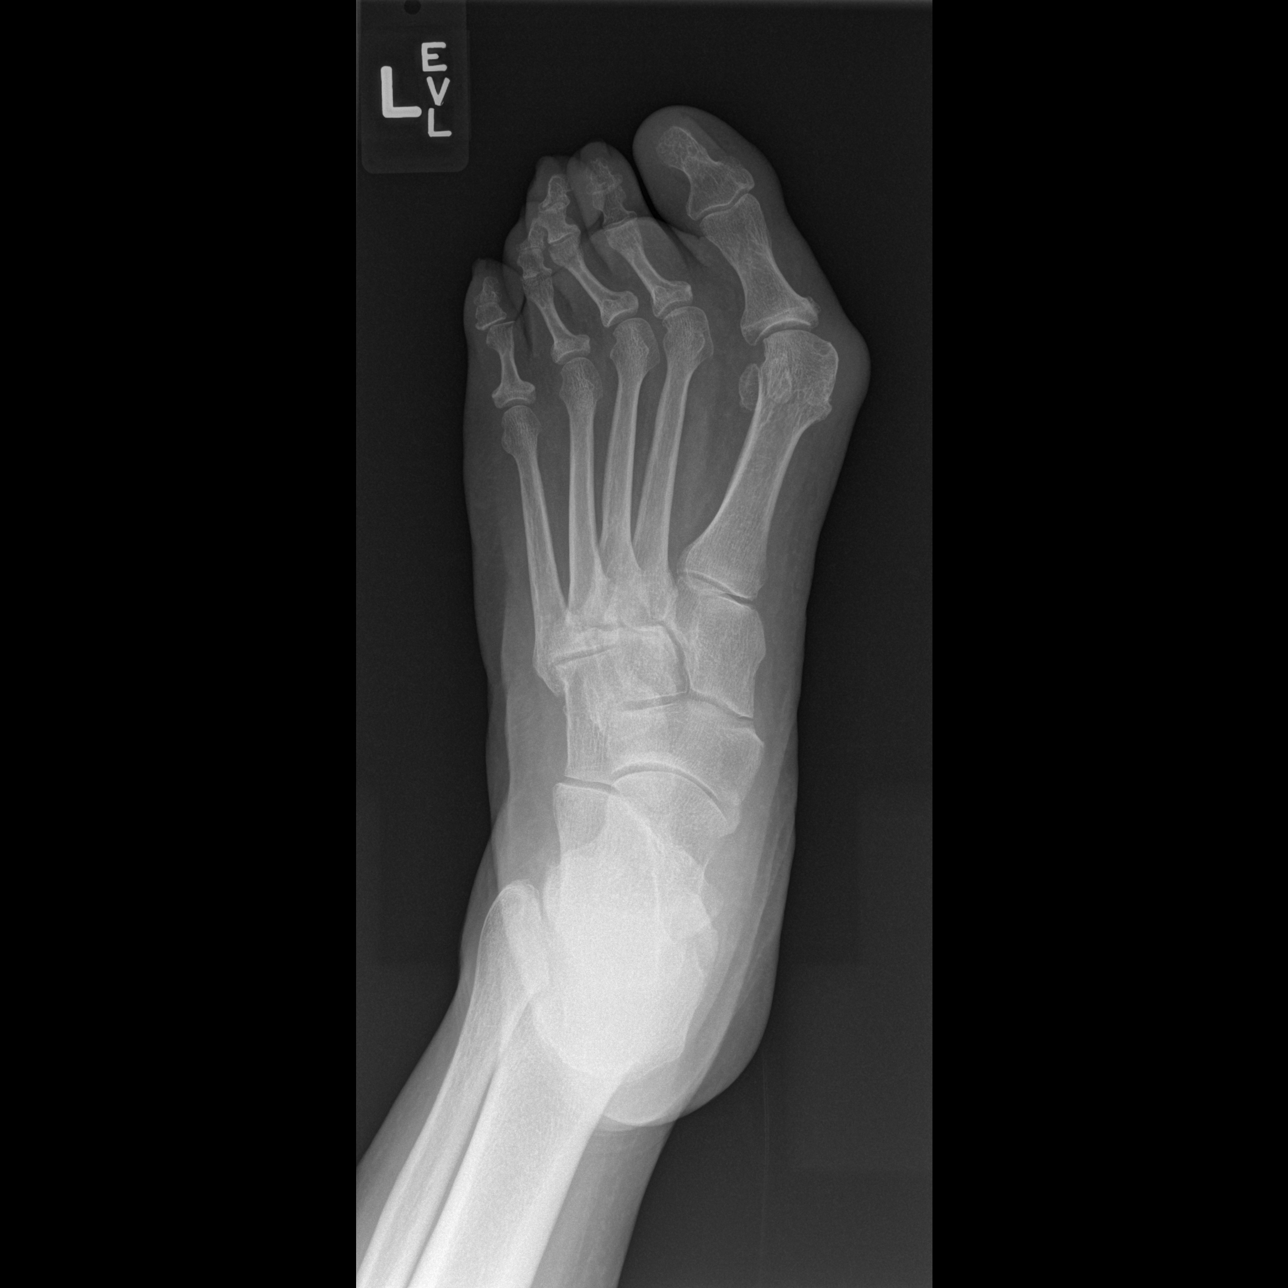

[t foot oblique left]
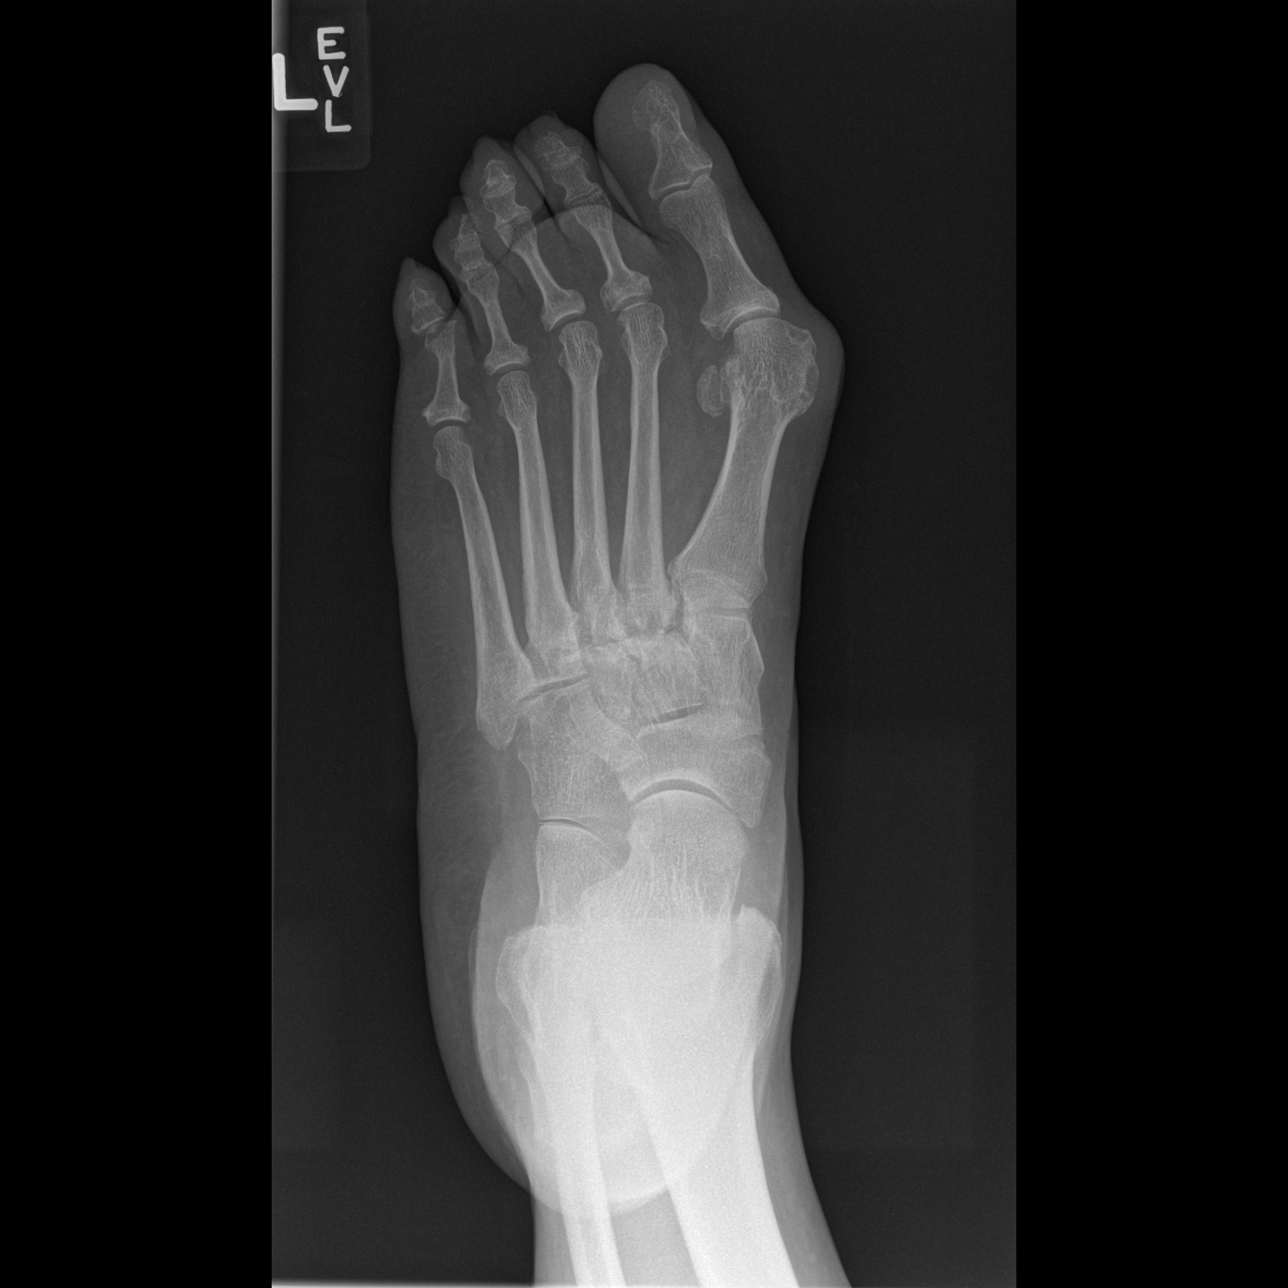

[t foot lat left]
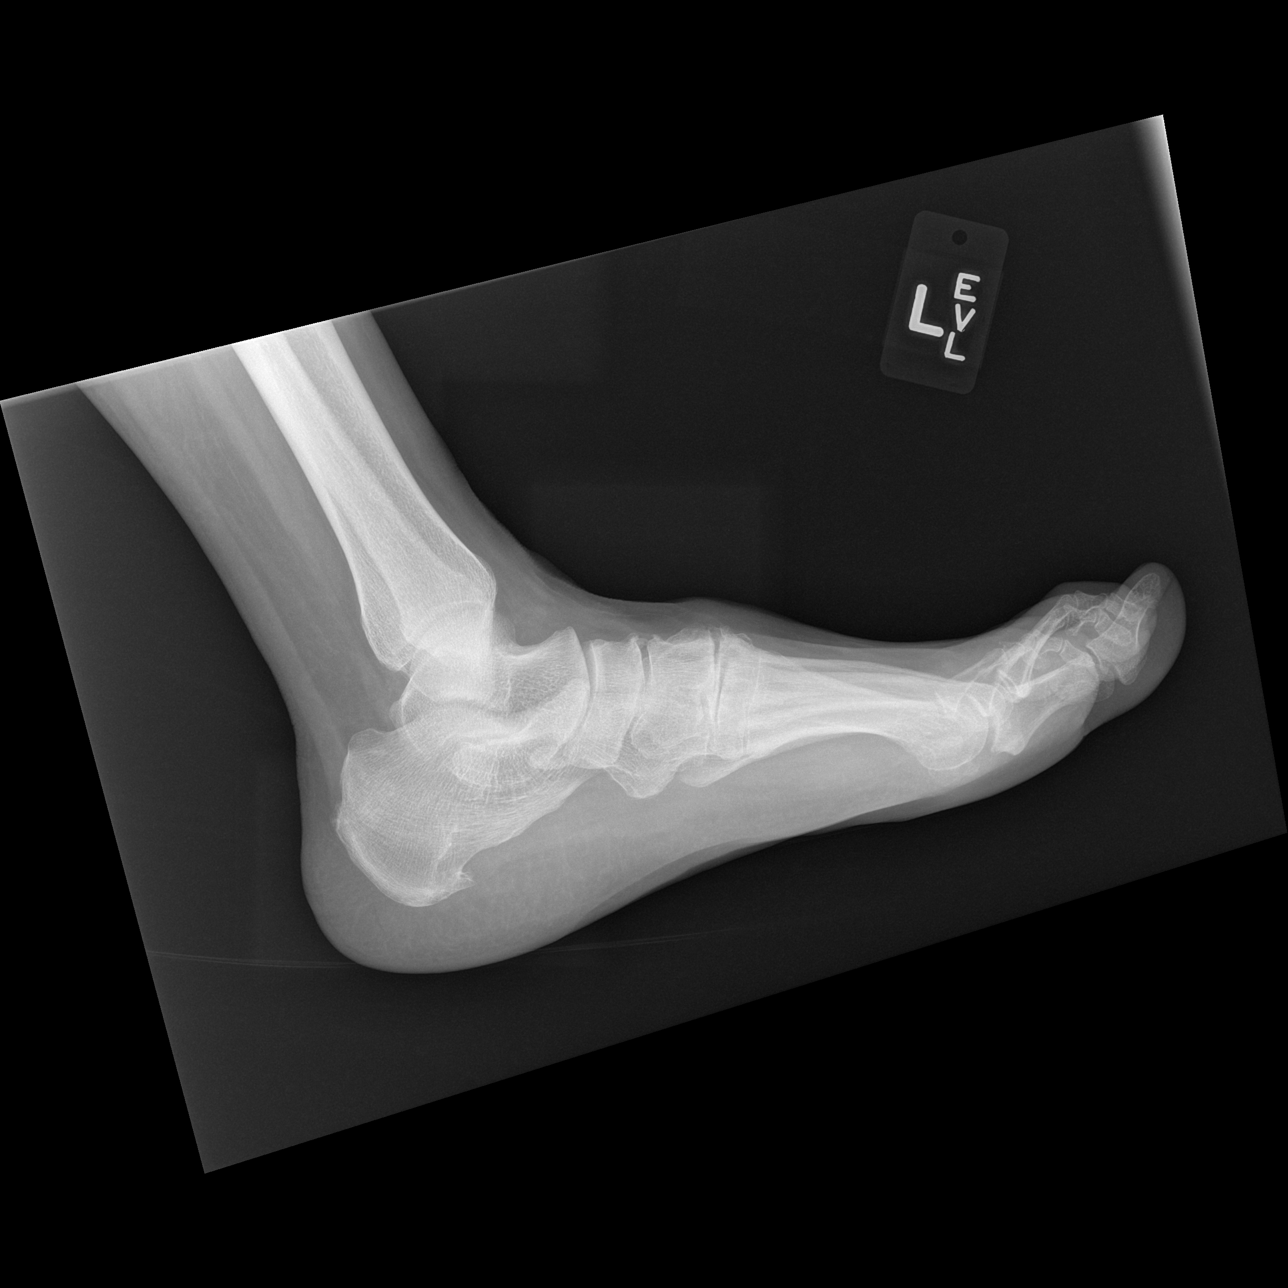

[3 of 3 positions shown; findings below may reference images not displayed]

FINDINGS: Advanced midfoot degenerative changes suspicious for early Charcot
disease. Un inflammatory arthropathy is also possible but probably
less likely at this location. There are mild degenerative changes at
the metatarsal phalangeal joints and interphalangeal joints.
Moderate degenerative changes involving the first metatarsal
phalangeal joint and probable subchondral cystic change or erosion.
There is significant hallux valgus deformity. No acute bony findings
or air destructive bony changes. Small calcaneal heel spur.
IMPRESSION: No acute fracture.

Advanced midfoot degenerative changes could reflect early Charcot
disease/ neuropathic process.

## 2021-07-01 ENCOUNTER — Emergency Department (HOSPITAL_BASED_OUTPATIENT_CLINIC_OR_DEPARTMENT_OTHER): Payer: Medicare HMO

## 2021-07-01 ENCOUNTER — Other Ambulatory Visit (HOSPITAL_BASED_OUTPATIENT_CLINIC_OR_DEPARTMENT_OTHER): Payer: Self-pay

## 2021-07-01 ENCOUNTER — Emergency Department (HOSPITAL_BASED_OUTPATIENT_CLINIC_OR_DEPARTMENT_OTHER)
Admission: EM | Admit: 2021-07-01 | Discharge: 2021-07-01 | Disposition: A | Discharge status: Home / Self Care | Payer: Medicare HMO | Attending: Emergency Medicine | Admitting: Emergency Medicine

## 2021-07-01 ENCOUNTER — Other Ambulatory Visit: Payer: Self-pay

## 2021-07-01 ENCOUNTER — Encounter (HOSPITAL_BASED_OUTPATIENT_CLINIC_OR_DEPARTMENT_OTHER): Payer: Self-pay

## 2021-07-01 DIAGNOSIS — I10 Essential (primary) hypertension: Secondary | ICD-10-CM | POA: Diagnosis not present

## 2021-07-01 DIAGNOSIS — G8929 Other chronic pain: Secondary | ICD-10-CM

## 2021-07-01 DIAGNOSIS — S3421XA Injury of nerve root of lumbar spine, initial encounter: Secondary | ICD-10-CM | POA: Insufficient documentation

## 2021-07-01 DIAGNOSIS — S3992XA Unspecified injury of lower back, initial encounter: Secondary | ICD-10-CM | POA: Diagnosis present

## 2021-07-01 DIAGNOSIS — F17210 Nicotine dependence, cigarettes, uncomplicated: Secondary | ICD-10-CM | POA: Diagnosis not present

## 2021-07-01 DIAGNOSIS — M5416 Radiculopathy, lumbar region: Secondary | ICD-10-CM

## 2021-07-01 DIAGNOSIS — X58XXXA Exposure to other specified factors, initial encounter: Secondary | ICD-10-CM | POA: Insufficient documentation

## 2021-07-01 DIAGNOSIS — Z79899 Other long term (current) drug therapy: Secondary | ICD-10-CM | POA: Diagnosis not present

## 2021-07-01 HISTORY — DX: Essential (primary) hypertension: I10

## 2021-07-01 LAB — URINALYSIS, ROUTINE W REFLEX MICROSCOPIC
Bilirubin Urine: NEGATIVE
Glucose, UA: NEGATIVE mg/dL
Hgb urine dipstick: NEGATIVE
Ketones, ur: NEGATIVE mg/dL
Leukocytes,Ua: NEGATIVE
Nitrite: NEGATIVE
Protein, ur: NEGATIVE mg/dL
Specific Gravity, Urine: >=1.03 (ref 1.005–1.030)
pH: 5 (ref 5.0–8.0)

## 2021-07-01 MED ORDER — ACETAMINOPHEN 325 MG PO TABS: 650.0000 mg | ORAL_TABLET | Freq: Four times a day (QID) | ORAL | 0 refills | Status: AC | PRN

## 2021-07-01 MED ORDER — METHOCARBAMOL 500 MG PO TABS: 1000.0000 mg | ORAL_TABLET | Freq: Two times a day (BID) | ORAL | 0 refills | Status: AC

## 2021-07-01 MED ORDER — LIDOCAINE 5 % EX PTCH: 1.0000 | MEDICATED_PATCH | Freq: Every day | CUTANEOUS | 0 refills | Status: DC | PRN

## 2021-07-01 MED ORDER — OXYCODONE HCL 5 MG PO TABS
5.0000 mg | ORAL_TABLET | Freq: Four times a day (QID) | ORAL | 0 refills | Status: AC | PRN
Start: 2021-07-01 — End: ?

## 2021-07-01 MED ORDER — IBUPROFEN 600 MG PO TABS: 600.0000 mg | ORAL_TABLET | Freq: Four times a day (QID) | ORAL | 0 refills | Status: AC | PRN

## 2021-07-01 MED ORDER — IBUPROFEN 800 MG PO TABS
800.0000 mg | ORAL_TABLET | Freq: Once | ORAL | Status: AC
  Administered 2021-07-01: 800 mg via ORAL
  Filled 2021-07-01: qty 1

## 2021-07-01 MED ORDER — LIDOCAINE 5 % EX PTCH
1.0000 | MEDICATED_PATCH | Freq: Every day | CUTANEOUS | 0 refills | Status: AC | PRN
  Filled 2021-07-01: qty 15, 15d supply, fill #0

## 2021-07-01 MED ORDER — LIDOCAINE 5 % EX PTCH
1.0000 | MEDICATED_PATCH | CUTANEOUS | Status: DC
  Administered 2021-07-01: 1 via TRANSDERMAL
  Filled 2021-07-01: qty 1

## 2021-07-01 MED ORDER — HYDROCODONE-ACETAMINOPHEN 5-325 MG PO TABS
1.0000 | ORAL_TABLET | Freq: Once | ORAL | Status: AC
  Administered 2021-07-01: 1 via ORAL
  Filled 2021-07-01: qty 1

## 2021-07-01 NOTE — ED Notes (Signed)
Pt transported to CT, will try to obtain urine specimen when Pt returns.  ?

## 2021-07-01 NOTE — ED Provider Notes (Signed)
?[facility] ?Provider Note ? ? ?CSN: [id number] ?Arrival date & time: 07/01/21  0816 ? ?  ? ?History ? ?Chief Complaint  ?Patient presents with  ? Back Pain  ? ? ?Sabrina Alexander is a 66 y.o. female. ? ? Patient as above with significant medical history as below, including bulging lumbar disc, hypertension, osteoarthritis who presents to the ED with complaint of back pain. ? ?Seen [facility] 3/19, received x-ray lumbar spine which did show degenerative changes at that time per radiology report.  At that time she was discharged with prednisone, Flexeril, lidocaine patches. ? ?Back pain ongoing for over a year.  Gradually worsening.  She is having some difficulty with standing up straight, ambulating secondary to the discomfort.  Pain getting out of bed.  She has been taking her Flexeril and Motrin but does not feel as though it is working well for her.  She has some tingling in her hands and feet b/l. Pain is to her low back only. No IVDU, no fevers, no steroid use, no recent spine injections, no fevers, no recent falls, no saddle paresthesias or urinary/bowel overflow/incontinence. ? ? ? ? ?Past Medical History: ?No date: Arthritis ?No date: Bulging of lumbar intervertebral disc ?No date: Hypertension ?No date: Osteoarthritis ? ?Past Surgical History: ?No date: ABDOMINAL HYSTERECTOMY ?No date: JOINT REPLACEMENT  ? ? ?The history is provided by the patient. No language interpreter was used.  ?Back Pain ?Associated symptoms: no abdominal pain, no chest pain, no fever and no headaches   ? ?  ? ?Home Medications ?Prior to Admission medications   ?Medication Sig Start Date End Date Taking? Authorizing Provider  ?acetaminophen (TYLENOL) 325 MG tablet Take 2 tablets (650 mg total) by mouth every 6 (six) hours as needed. 07/01/21  Yes Wynona Dove A, DO  ?ibuprofen (ADVIL) 600 MG tablet Take 1 tablet (600 mg total) by mouth every 6 (six) hours as needed. 07/01/21  Yes Wynona Dove A, DO   ?lidocaine (LIDODERM) 5 % Place 1 patch onto the skin daily as needed. Remove & Discard patch within 12 hours or as directed by MD 07/01/21  Yes Jeanell Sparrow, DO  ?methocarbamol (ROBAXIN) 500 MG tablet Take 2 tablets (1,000 mg total) by mouth 2 (two) times daily for 5 days. 07/01/21 07/06/21 Yes Jeanell Sparrow, DO  ?naproxen (NAPROSYN) 500 MG tablet Take 500 mg by mouth 2 (two) times daily with a meal.   Yes [provider]  ?oxyCODONE (ROXICODONE) 5 MG immediate release tablet Take 1 tablet (5 mg total) by mouth every 6 (six) hours as needed for severe pain. 07/01/21  Yes Jeanell Sparrow, DO  ?triamterene-hydrochlorothiazide (DYAZIDE) 37.5-25 MG capsule Take 1 capsule by mouth daily.   Yes [provider]  ?estradiol (ESTRACE) 1 MG tablet Take 1 mg by mouth. 09/09/14   [provider]  ?HYDROcodone-acetaminophen (NORCO) 5-325 MG tablet Take 1 tablet by mouth every 6 (six) hours as needed for moderate pain. 05/15/15   Dene Gentry, MD  ?HYDROcodone-acetaminophen (NORCO/VICODIN) 5-325 MG tablet Take 1-2 tablets by mouth every 4 (four) hours as needed for moderate pain or severe pain. 02/22/16   Charlesetta Shanks, MD  ?meloxicam (MOBIC) 15 MG tablet Take 1 tablet (15 mg total) by mouth daily. 05/15/15   Dene Gentry, MD  ?methylPREDNISolone (MEDROL DOSEPAK) 4 MG TBPK tablet Per pack instructions 02/22/16   Charlesetta Shanks, MD  ?   ? ?Allergies    ?Clindamycin/lincomycin   ? ?  Review of Systems   ?Review of Systems  ?Constitutional:  Negative for chills and fever.  ?HENT:  Negative for facial swelling and trouble swallowing.   ?Eyes:  Negative for photophobia and visual disturbance.  ?Respiratory:  Negative for cough and shortness of breath.   ?Cardiovascular:  Negative for chest pain and palpitations.  ?Gastrointestinal:  Negative for abdominal pain, nausea and vomiting.  ?Endocrine: Negative for polydipsia and polyuria.  ?Genitourinary:  Negative for difficulty urinating and hematuria.   ?Musculoskeletal:  Positive for back pain. Negative for gait problem and joint swelling.  ?Skin:  Negative for pallor and rash.  ?Neurological:  Negative for syncope and headaches.  ?Psychiatric/Behavioral:  Negative for agitation and confusion.   ? ?Physical Exam ?Updated Vital Signs ?BP (!) 148/73   Pulse 78   Temp 98.2 ?F (36.8 ?C) (Oral)   Resp 18   Ht 5\' 5"  (1.651 m)   Wt 83 kg   SpO2 100%   BMI 30.45 kg/m?  ?Physical Exam ?Vitals and nursing note reviewed.  ?Constitutional:   ?   General: She is not in acute distress. ?   Appearance: Normal appearance. She is not ill-appearing or diaphoretic.  ?HENT:  ?   Head: Normocephalic and atraumatic.  ?   Right Ear: External ear normal.  ?   Left Ear: External ear normal.  ?   Nose: Nose normal.  ?   Mouth/Throat:  ?   Mouth: Mucous membranes are moist.  ?Eyes:  ?   General: No scleral icterus.    ?   Right eye: No discharge.     ?   Left eye: No discharge.  ?Cardiovascular:  ?   Rate and Rhythm: Normal rate and regular rhythm.  ?   Pulses: Normal pulses.     ?     Radial pulses are 2+ on the right side and 2+ on the left side.  ?     Posterior tibial pulses are 2+ on the right side and 2+ on the left side.  ?   Heart sounds: Normal heart sounds.  ?Pulmonary:  ?   Effort: Pulmonary effort is normal. No respiratory distress.  ?   Breath sounds: Normal breath sounds.  ?Abdominal:  ?   General: Abdomen is flat.  ?   Tenderness: There is no abdominal tenderness.  ?Musculoskeletal:     ?   General: Normal range of motion.  ?     Arms: ? ?   Cervical back: Normal range of motion.  ?   Right lower leg: No edema.  ?   Left lower leg: No edema.  ?   Comments: TTP around L4-L5  ?Skin: ?   General: Skin is warm and dry.  ?   Capillary Refill: Capillary refill takes less than 2 seconds.  ?Neurological:  ?   Mental Status: She is alert and oriented to person, place, and time.  ?   GCS: GCS eye subscore is 4. GCS verbal subscore is 5. GCS motor subscore is 6.  ?   Sensory:  Sensation is intact.  ?   Motor: Motor function is intact.  ?   Comments: Strength equal all 4 extremities  ?Psychiatric:     ?   Mood and Affect: Mood normal.     ?   Behavior: Behavior normal.  ? ? ?ED Results / Procedures / Treatments   ?Labs ?(all labs ordered are listed, but only abnormal results are displayed) ?Labs Reviewed  ?URINALYSIS, ROUTINE W  REFLEX MICROSCOPIC - Abnormal; Notable for the following components:  ?    Result Value  ? APPearance HAZY (*)   ? All other components within normal limits  ? ? ?EKG ?None ? ?Radiology ?CT Lumbar Spine Wo Contrast ? ?Result Date: 07/01/2021 ?CLINICAL DATA:  Low back pain radiating to patient's legs when she stands. EXAM: CT LUMBAR SPINE WITHOUT CONTRAST TECHNIQUE: Multidetector CT imaging of the lumbar spine was performed without intravenous contrast administration. Multiplanar CT image reconstructions were also generated. RADIATION DOSE REDUCTION: This exam was performed according to the departmental dose-optimization program which includes automated exposure control, adjustment of the mA and/or kV according to patient size and/or use of iterative reconstruction technique. COMPARISON:  05/17/2021 radiographs, and lumbar MRI from 08/22/2013 FINDINGS: Segmentation: The lowest lumbar type non-rib-bearing vertebra is labeled as L5. Alignment: 3 mm degenerative retrolisthesis at L5-S1. Vertebrae: Spina bifida occulta T11 and T12. No acute bony findings. No lumbar spine fracture. Mild levoconvex lumbar scoliosis. Paraspinal and other soft tissues: Atherosclerosis is present, including aortoiliac atherosclerotic disease. Disc levels: L1-2: Unremarkable L2-3: No impingement.  Mild disc bulge. L3-4: Prominent central narrowing of the thecal sac and moderate right and mild left foraminal stenosis along with likely bilateral subarticular lateral recess stenosis due to diffuse disc bulge, ligamentum flavum redundancy, and facet arthropathy. L4-5: Mild central narrowing of the  thecal sac with mild right foraminal stenosis and mild right subarticular lateral recess stenosis due to disc bulge and degenerative facet arthropathy. L5-S1: Mild bilateral foraminal stenosis due to facet spurring. Mild

## 2021-07-01 NOTE — Discharge Instructions (Addendum)
Please follow-up with your Primary Care Physician within the next week. Please take your medications as instructed and discuss any changes to your medications with your primary care physician.    Please return to the Emergency Department if you have any leg numbness, leg weakness, difficulty walking, fevers, worsening of pain, lightheadedness, lose consciousness, severe abdominal pain, severe headache, difficulty urinating, or difficulty having a bowel movement.   Please return to the emergency department immediately for any new or concerning symptoms, or if you get worse.  It was a pleasure caring for you today in the emergency department.  Please return to the emergency department for any worsening or worrisome symptoms.  

## 2021-07-01 NOTE — ED Triage Notes (Addendum)
Pt reports lower back pain for over one month. Pain has increased to where she cant stand up straight with difficulty ambulating. Difficulty raising both arms due to pain with hands going numb. Increase incontinence  ?

## 2023-03-16 ENCOUNTER — Other Ambulatory Visit: Payer: Self-pay

## 2023-03-25 IMAGING — CT CT L SPINE W/O CM
3 of 5 series · 9 of 33 positions shown, 10 images · non-contrast
Comparison: 05/17/2021 radiographs, and lumbar MRI from 08/22/2013

CLINICAL DATA: Low back pain radiating to patient's legs when she
stands.



[Series 7: coronal bone · coronal · 0.21mm/px · 3 of 57 slices shown]
[im 12/57  bone]
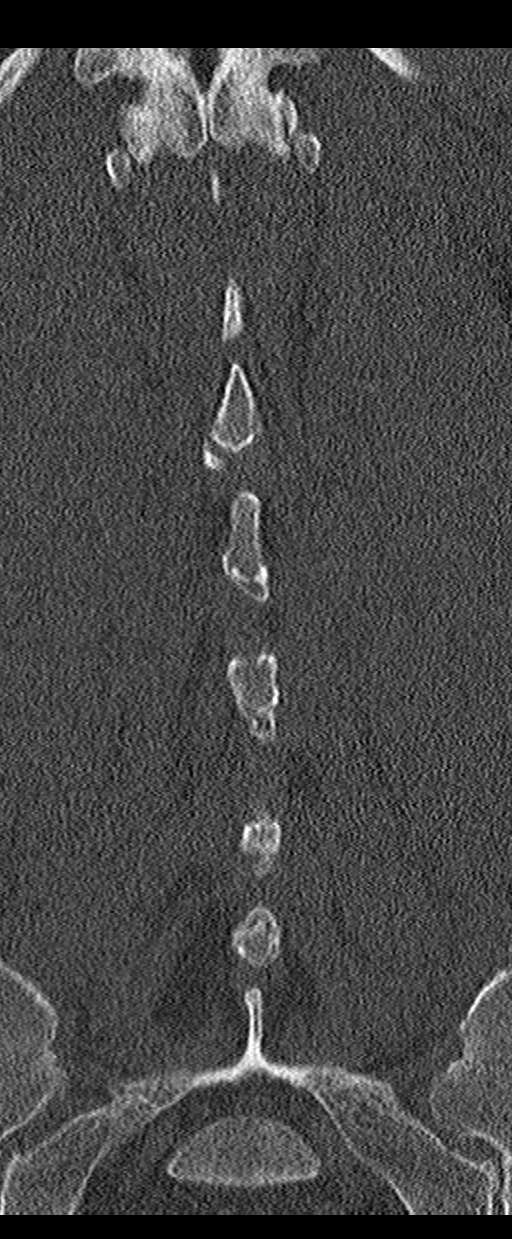
[im 23/57  bone]
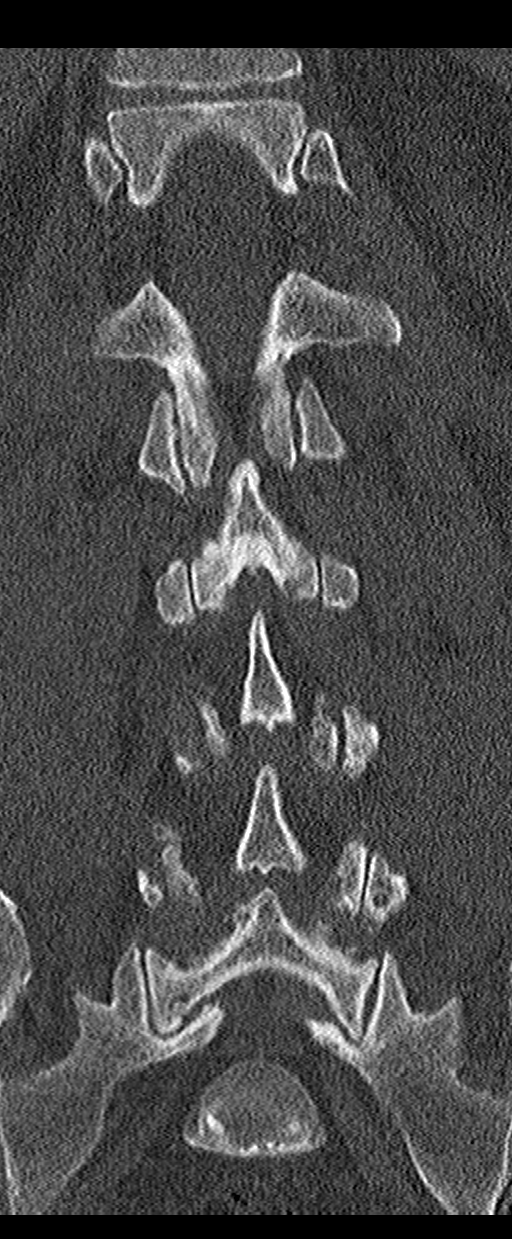
[im 34/57  bone]
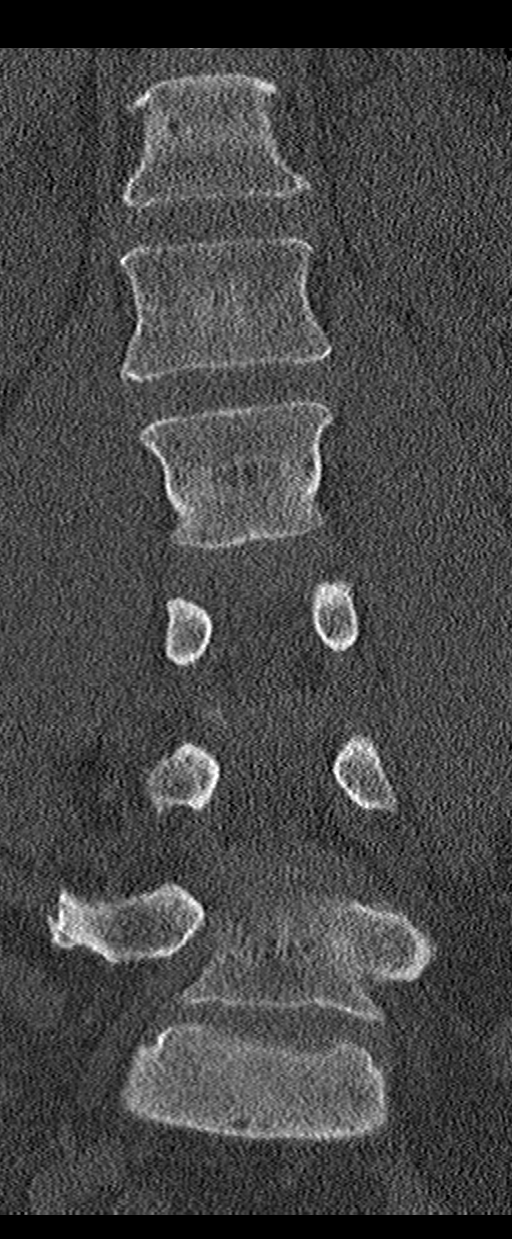

[Series 8: sagittal soft · sagittal · 0.26mm/px · 5 of 54 slices shown]
[im 18/54  bone]
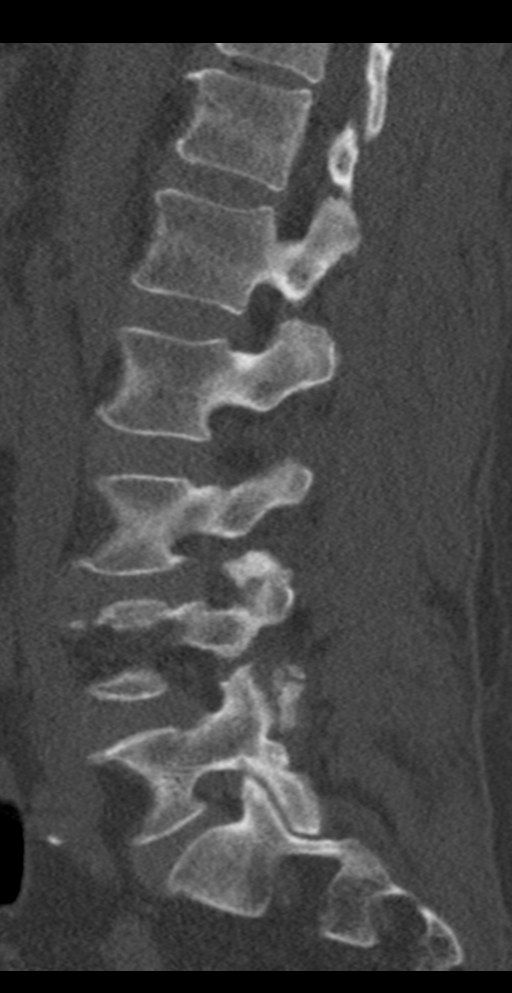
[im 23/54  bone]
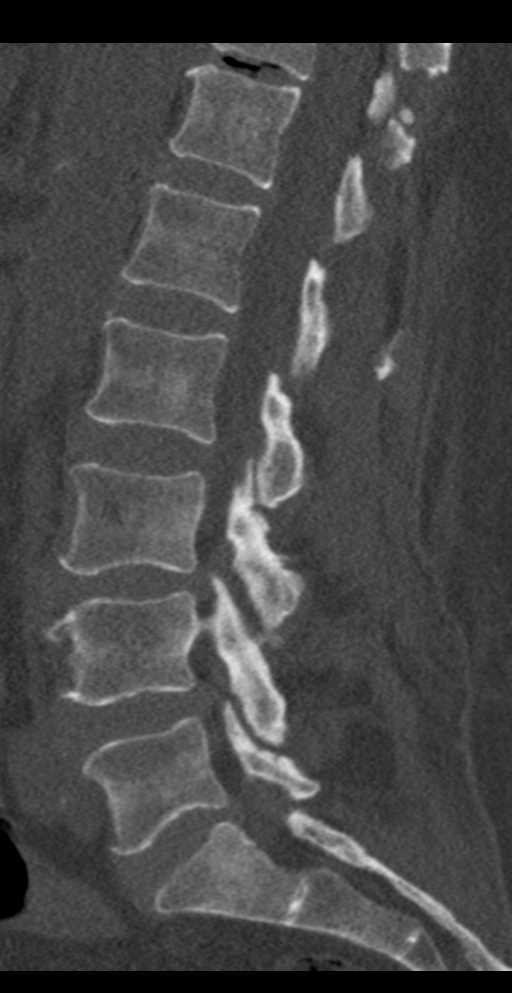
[im 27/54  bone]
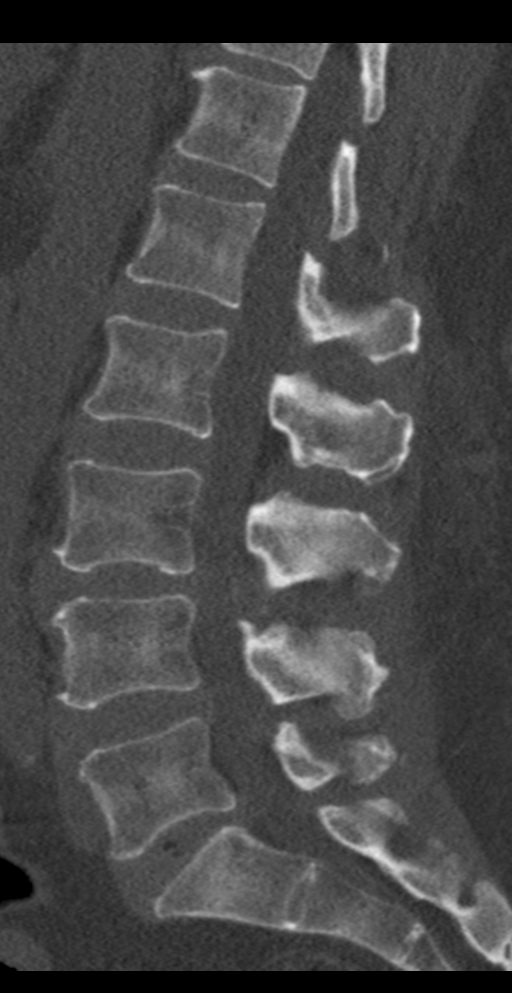
[im 31/54  bone]
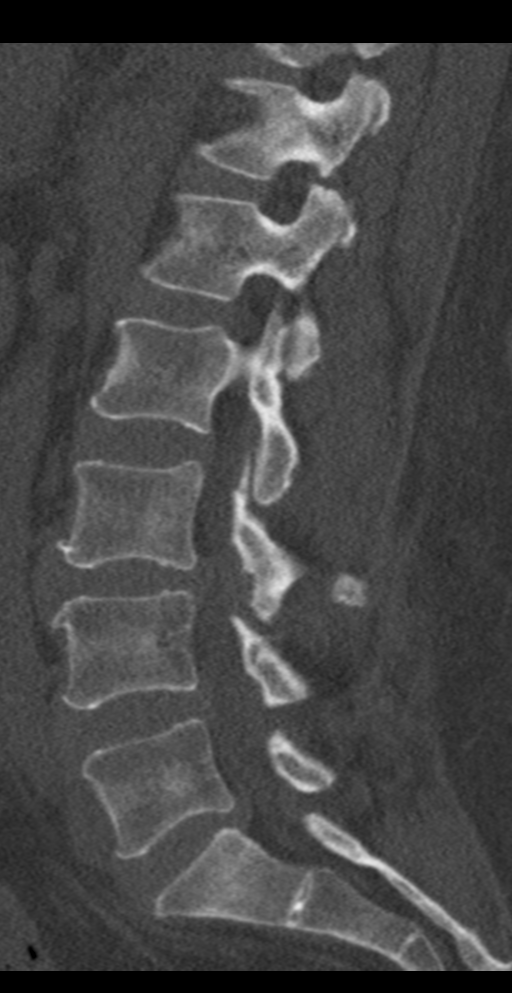
[im 36/54  bone]
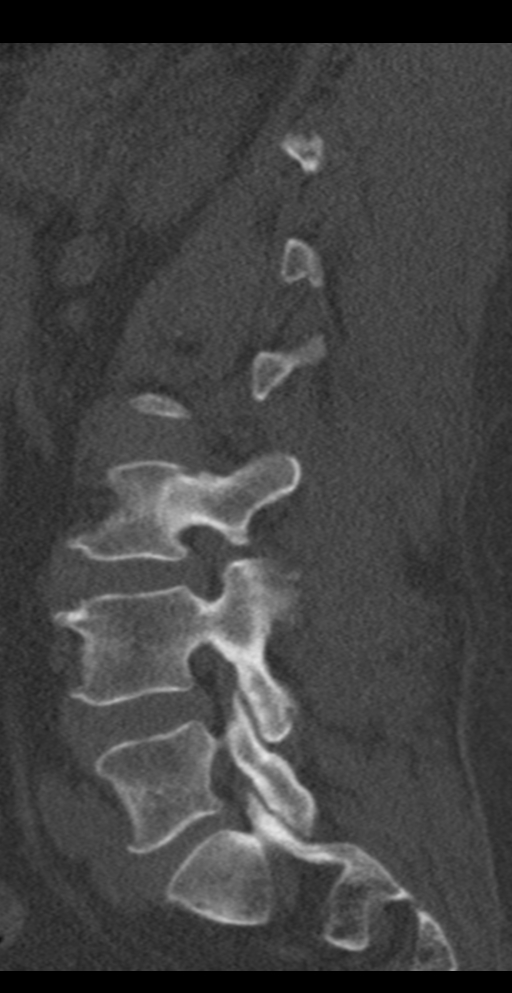

[Series 9: orthogonal axials bone · axial · 0.23mm/px · z∈[-164,-164]mm · 1 of 124 slices shown, 2 images]
[im 62/124  soft-tissue]
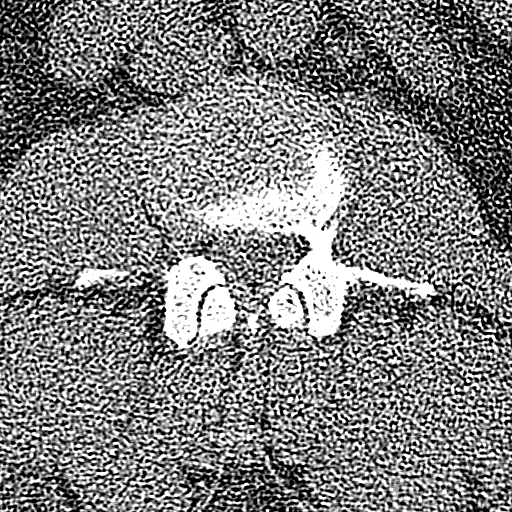
[im 62/124  bone]
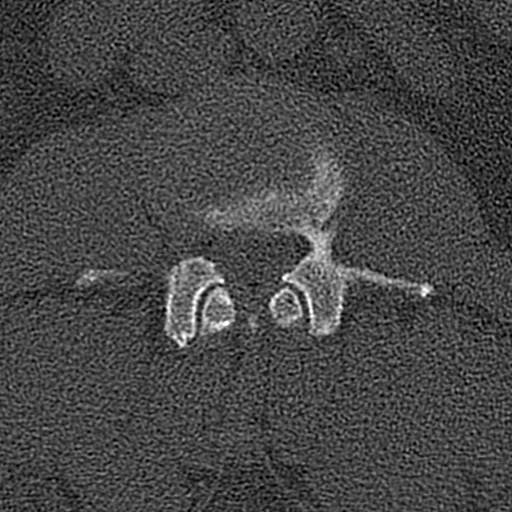

[9 of 33 positions shown; findings below may reference images not displayed]

FINDINGS: Segmentation: The lowest lumbar type non-rib-bearing vertebra is
labeled as L5.

Alignment: 3 mm degenerative retrolisthesis at L5-S1.

Vertebrae: Spina bifida occulta T11 and T12. No acute bony findings.
No lumbar spine fracture. Mild levoconvex lumbar scoliosis.

Paraspinal and other soft tissues: Atherosclerosis is present,
including aortoiliac atherosclerotic disease.

Disc levels: L1-2: Unremarkable

L2-3: No impingement.  Mild disc bulge.

L3-4: Prominent central narrowing of the thecal sac and moderate
right and mild left foraminal stenosis along with likely bilateral
subarticular lateral recess stenosis due to diffuse disc bulge,
ligamentum flavum redundancy, and facet arthropathy.

L4-5: Mild central narrowing of the thecal sac with mild right
foraminal stenosis and mild right subarticular lateral recess
stenosis due to disc bulge and degenerative facet arthropathy.

L5-S1: Mild bilateral foraminal stenosis due to facet spurring. Mild
disc bulge.
IMPRESSION: 1. Lumbar spondylosis and degenerative disc disease, resulting in
prominent impingement at L3-4 and mild impingement at L4-5 and
L5-S1, as noted above.
2. Spina bifida occulta at T11 and T12.
3. Mild levoconvex lumbar scoliosis.
4.  Aortic Atherosclerosis ([id number]).
